# Patient Record
Sex: Female | Born: 1990 | Race: White | Hispanic: No | Smoking: Never smoker
Health system: Southern US, Community
[De-identification: ages and names within clinical notes are randomized; demographics above are authoritative.]

---

## 2013-12-29 ENCOUNTER — Encounter (HOSPITAL_COMMUNITY): Payer: Self-pay | Admitting: *Deleted

## 2013-12-29 ENCOUNTER — Emergency Department (INDEPENDENT_AMBULATORY_CARE_PROVIDER_SITE_OTHER): Payer: BC Managed Care – PPO

## 2013-12-29 ENCOUNTER — Emergency Department (INDEPENDENT_AMBULATORY_CARE_PROVIDER_SITE_OTHER)
Admission: EM | Admit: 2013-12-29 | Discharge: 2013-12-29 | Disposition: A | Discharge status: Home / Self Care | Payer: BC Managed Care – PPO | Source: Home / Self Care | Attending: Family Medicine | Admitting: Family Medicine

## 2013-12-29 DIAGNOSIS — S93401A Sprain of unspecified ligament of right ankle, initial encounter: Secondary | ICD-10-CM

## 2013-12-29 NOTE — ED Provider Notes (Signed)
CSN: [id number]     Arrival date & time 12/29/13  1718 History   First MD Initiated Contact with Patient 12/29/13 1724     Chief Complaint  Patient presents with  . Ankle Pain   (Consider location/radiation/quality/duration/timing/severity/associated sxs/prior Treatment) Patient is a 23 y.o. female presenting with ankle pain. The history is provided by the patient.  Ankle Pain Location:  Ankle Time since incident:  2 hours Injury: yes   Mechanism of injury: fall   Fall:    Fall occurred:  Running (slipped on hill.) Ankle location:  R ankle Pain details:    Severity:  Mild   Onset quality:  Sudden Chronicity:  New Dislocation: no   Prior injury to area:  No Relieved by:  None tried Worsened by:  Nothing tried Ineffective treatments:  None tried Associated symptoms: decreased ROM and swelling   Associated symptoms: no back pain, no numbness and no tingling   Risk factors: no concern for non-accidental trauma     History reviewed. No pertinent past medical history. History reviewed. No pertinent past surgical history. No family history on file. History  Substance Use Topics  . Smoking status: Never Smoker   . Smokeless tobacco: Not on file  . Alcohol Use: Yes   OB History    No data available     Review of Systems  Constitutional: Negative.   Gastrointestinal: Negative.   Musculoskeletal: Positive for joint swelling and gait problem. Negative for back pain.  Skin: Negative.     Allergies  Review of patient's allergies indicates no known allergies.  Home Medications   Prior to Admission medications   Not on File   BP 128/76 mmHg  Pulse 90  Temp(Src) 99.2 F (37.3 C) (Oral)  Resp 16  SpO2 99%  LMP 12/03/2013 Physical Exam  Constitutional: She is oriented to person, place, and time. She appears well-developed and well-nourished.  Musculoskeletal: She exhibits tenderness.       Right ankle: She exhibits decreased range of motion and swelling. She  exhibits no ecchymosis, no deformity and normal pulse. Tenderness. Lateral malleolus tenderness found. No medial malleolus, no head of 5th metatarsal and no proximal fibula tenderness found.       Feet:  Neurological: She is alert and oriented to person, place, and time.  Skin: Skin is warm and dry.  Nursing note and vitals reviewed.   ED Course  Procedures (including critical care time) Labs Review Labs Reviewed - No data to display  Imaging Review Dg Ankle Complete Right  12/29/2013   CLINICAL DATA:  Right ankle pain, running injury  EXAM: RIGHT ANKLE - COMPLETE 3+ VIEW  COMPARISON:  None.  FINDINGS: Possible nondisplaced fracture involving the inferior aspect of the lateral malleolus. Correlate for point tenderness.  Otherwise, no fracture is seen.  Ankle mortise is preserved.  The base of the fifth metatarsal is unremarkable.  Mild lateral soft tissue swelling  IMPRESSION: Possible nondisplaced fracture involving the inferior aspect of the lateral malleolus. Correlate for point tenderness.   Electronically Signed   By: Charline BillsSriyesh  Krishnan M.D.   On: 12/29/2013 18:23   X-rays reviewed and report per radiologist.   MDM   1. Ankle sprain, right, initial encounter        Linna HoffJames D Reginae Wolfrey, MD 12/29/13 [phone]

## 2013-12-29 NOTE — ED Notes (Signed)
Pt  Reports  While  Running  He     Twisted    Her  r  Ankle   When she  Felled  On a  Trail   She    Has  Pain /  Swelling  Present

## 2013-12-29 NOTE — Discharge Instructions (Signed)
Wear ankle support as needed for comfort, activity as tolerated. advil and ice as needed, return or see orthopedist if further problems. °

## 2016-04-28 IMAGING — CR DG ANKLE COMPLETE 3+V*R*
3 series · 3 of 3 positions shown · non-contrast
Comparison: None.

CLINICAL DATA: Right ankle pain, running injury

EXAM:
RIGHT ANKLE - COMPLETE 3+ VIEW

[ankle ap]
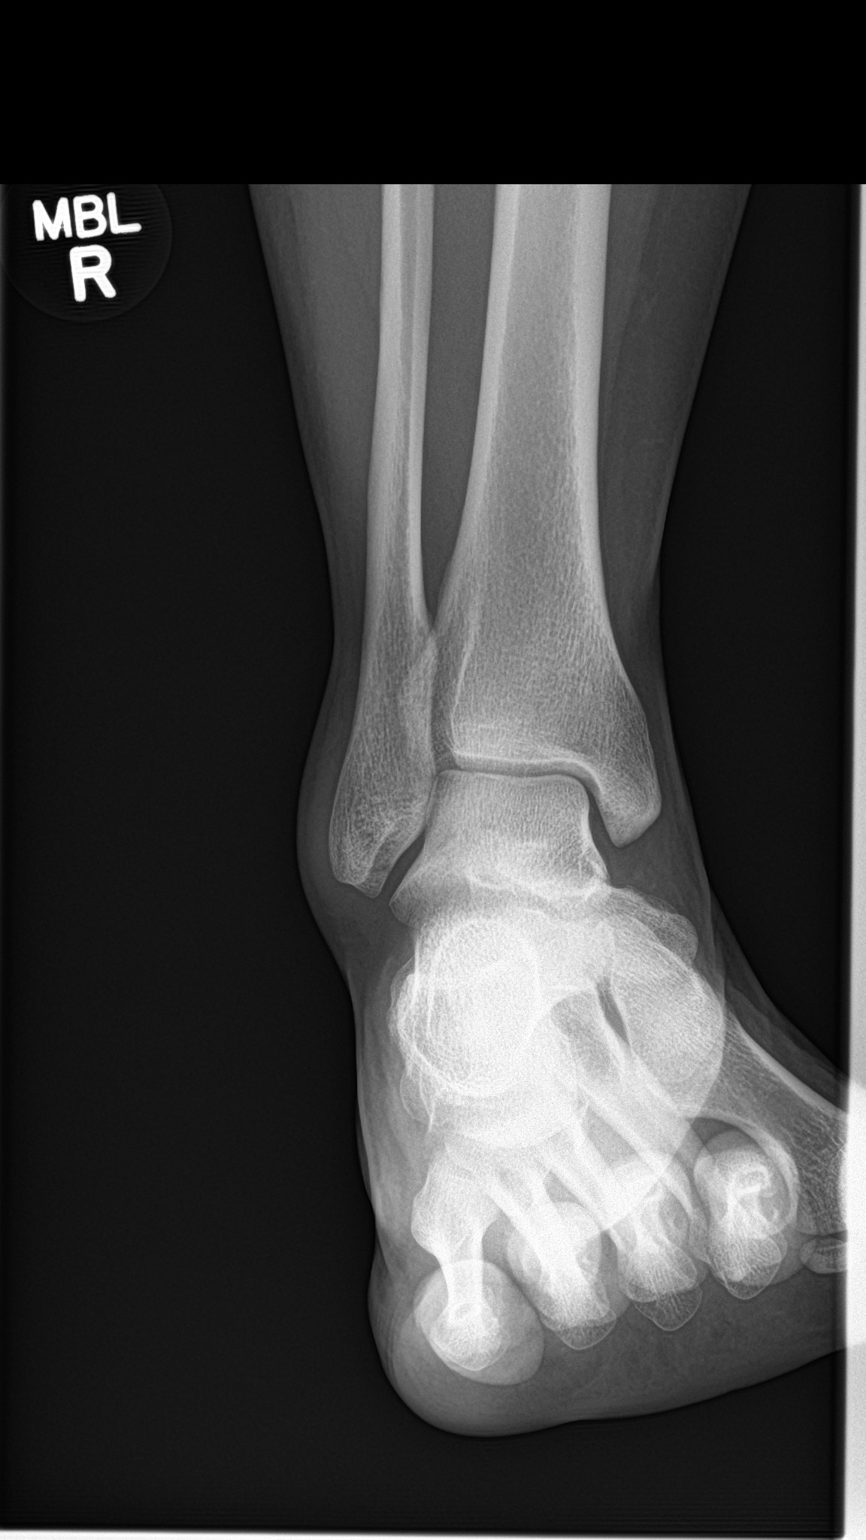

[ankle lat]
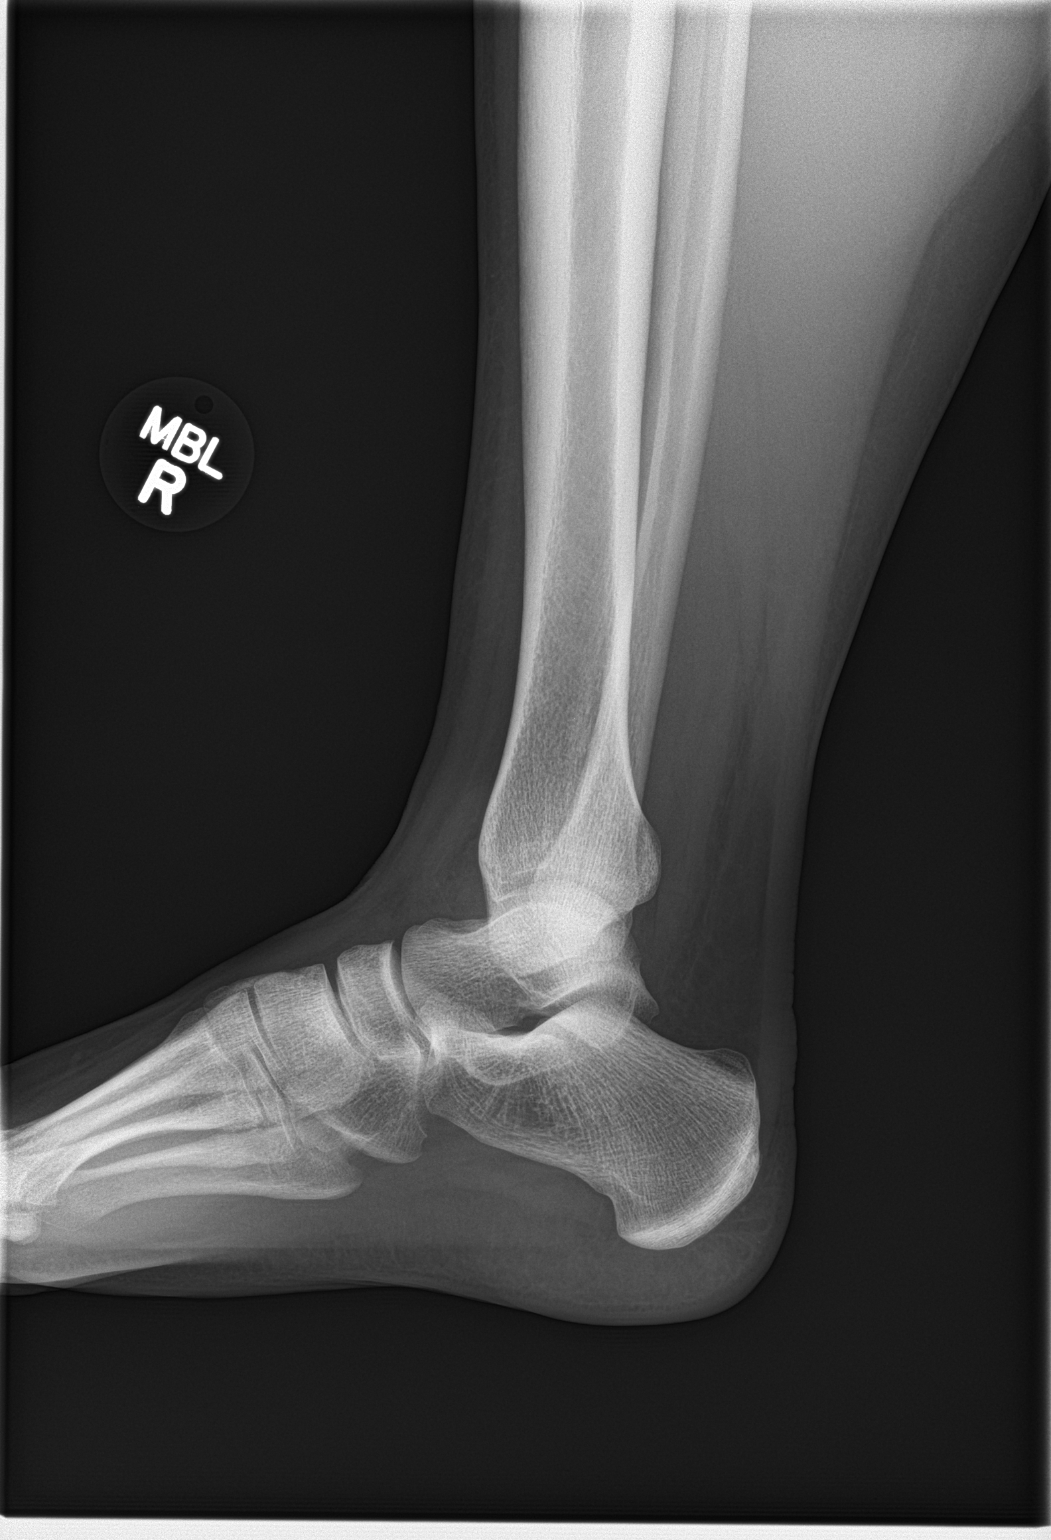

[ankle obl]
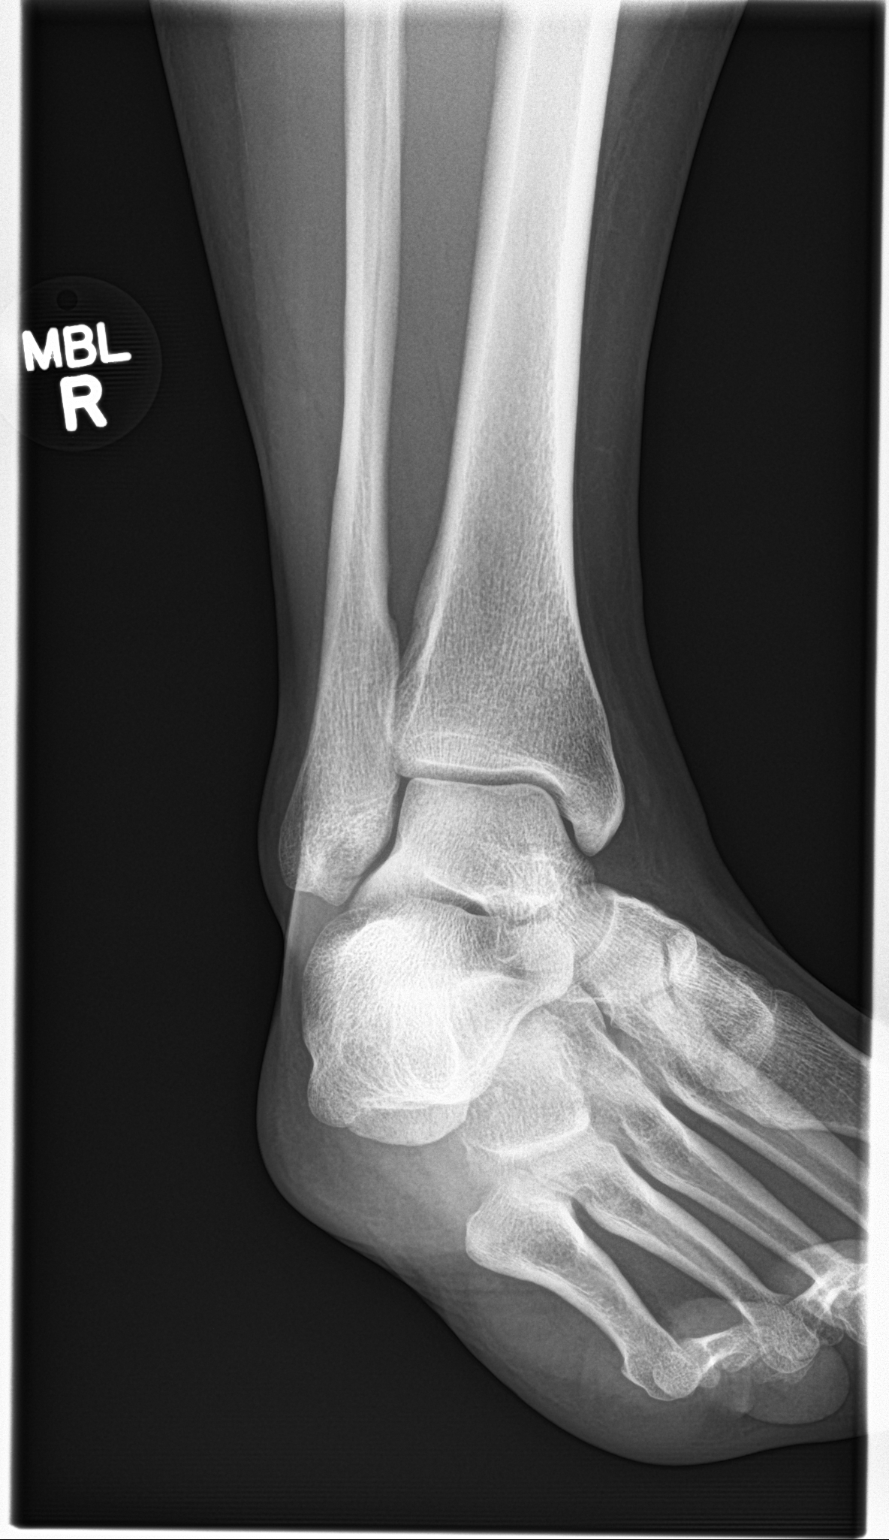

[3 of 3 positions shown; findings below may reference images not displayed]

FINDINGS: Possible nondisplaced fracture involving the inferior aspect of the
lateral malleolus. Correlate for point tenderness.

Otherwise, no fracture is seen.

Ankle mortise is preserved.

The base of the fifth metatarsal is unremarkable.

Mild lateral soft tissue swelling
IMPRESSION: Possible nondisplaced fracture involving the inferior aspect of the
lateral malleolus. Correlate for point tenderness.
